# Patient Record
Sex: Male | Born: 1970 | Race: Black or African American | Hispanic: No | Marital: Single | Smoking: Former smoker
Health system: Southern US, Community
[De-identification: ages and names within clinical notes are randomized; demographics above are authoritative.]

## PROBLEM LIST (undated history)

## (undated) HISTORY — PX: KNEE SURGERY: SHX244

---

## 2000-12-14 ENCOUNTER — Emergency Department (HOSPITAL_COMMUNITY): Admission: EM | Admit: 2000-12-14 | Discharge: 2000-12-15 | Payer: Self-pay | Admitting: Emergency Medicine

## 2001-05-21 ENCOUNTER — Encounter: Payer: Self-pay | Admitting: Emergency Medicine

## 2001-05-21 ENCOUNTER — Emergency Department (HOSPITAL_COMMUNITY): Admission: EM | Admit: 2001-05-21 | Discharge: 2001-05-21 | Payer: Self-pay | Admitting: Emergency Medicine

## 2005-06-25 ENCOUNTER — Encounter: Payer: Self-pay | Admitting: Emergency Medicine

## 2005-06-26 ENCOUNTER — Inpatient Hospital Stay (HOSPITAL_COMMUNITY): Admission: EM | Admit: 2005-06-26 | Discharge: 2005-06-28 | Payer: Self-pay | Admitting: Emergency Medicine

## 2008-12-29 ENCOUNTER — Emergency Department (HOSPITAL_COMMUNITY): Admission: EM | Admit: 2008-12-29 | Discharge: 2008-12-29 | Payer: Self-pay | Admitting: Emergency Medicine

## 2010-11-17 ENCOUNTER — Emergency Department (HOSPITAL_COMMUNITY): Payer: Self-pay

## 2010-11-17 ENCOUNTER — Emergency Department (HOSPITAL_COMMUNITY)
Admission: EM | Admit: 2010-11-17 | Discharge: 2010-11-17 | Disposition: A | Discharge status: Home / Self Care | Payer: Self-pay | Attending: Emergency Medicine | Admitting: Emergency Medicine

## 2010-11-17 DIAGNOSIS — E876 Hypokalemia: Secondary | ICD-10-CM | POA: Insufficient documentation

## 2010-11-17 DIAGNOSIS — R209 Unspecified disturbances of skin sensation: Secondary | ICD-10-CM | POA: Insufficient documentation

## 2010-11-17 DIAGNOSIS — F17200 Nicotine dependence, unspecified, uncomplicated: Secondary | ICD-10-CM | POA: Insufficient documentation

## 2010-11-17 DIAGNOSIS — J45909 Unspecified asthma, uncomplicated: Secondary | ICD-10-CM | POA: Insufficient documentation

## 2010-11-17 DIAGNOSIS — F411 Generalized anxiety disorder: Secondary | ICD-10-CM | POA: Insufficient documentation

## 2010-11-17 DIAGNOSIS — R079 Chest pain, unspecified: Secondary | ICD-10-CM | POA: Insufficient documentation

## 2010-11-17 LAB — POCT I-STAT, CHEM 8
BUN: 6 mg/dL (ref 6–23)
Creatinine, Ser: 1.4 mg/dL (ref 0.4–1.5)
Glucose, Bld: 110 mg/dL — ABNORMAL HIGH (ref 70–99)
Hemoglobin: 16 g/dL (ref 13.0–17.0)
TCO2: 20 mmol/L (ref 0–100)

## 2010-11-17 LAB — RAPID URINE DRUG SCREEN, HOSP PERFORMED
Amphetamines: NOT DETECTED
Barbiturates: NOT DETECTED
Benzodiazepines: NOT DETECTED
Cocaine: NOT DETECTED

## 2010-11-17 LAB — DIFFERENTIAL
Basophils Absolute: 0 10*3/uL (ref 0.0–0.1)
Basophils Relative: 1 % (ref 0–1)
Lymphocytes Relative: 54 % — ABNORMAL HIGH (ref 12–46)
Monocytes Absolute: 0.5 10*3/uL (ref 0.1–1.0)
Monocytes Relative: 11 % (ref 3–12)
Neutro Abs: 1.6 10*3/uL — ABNORMAL LOW (ref 1.7–7.7)
Neutrophils Relative %: 33 % — ABNORMAL LOW (ref 43–77)

## 2010-11-17 LAB — ETHANOL: Alcohol, Ethyl (B): 95 mg/dL — ABNORMAL HIGH (ref 0–10)

## 2010-11-17 LAB — CBC
HCT: 41.8 % (ref 39.0–52.0)
Hemoglobin: 15.3 g/dL (ref 13.0–17.0)
MCH: 31.9 pg (ref 26.0–34.0)
MCHC: 36.6 g/dL — ABNORMAL HIGH (ref 30.0–36.0)
RBC: 4.79 MIL/uL (ref 4.22–5.81)

## 2010-11-17 LAB — POCT CARDIAC MARKERS: Myoglobin, poc: 47.5 ng/mL (ref 12–200)
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
## 2011-02-03 ENCOUNTER — Emergency Department (HOSPITAL_COMMUNITY)
Admission: EM | Admit: 2011-02-03 | Discharge: 2011-02-03 | Disposition: A | Discharge status: Home / Self Care | Payer: Self-pay | Attending: Emergency Medicine | Admitting: Emergency Medicine

## 2011-02-03 DIAGNOSIS — J40 Bronchitis, not specified as acute or chronic: Secondary | ICD-10-CM | POA: Insufficient documentation

## 2011-02-03 DIAGNOSIS — R0609 Other forms of dyspnea: Secondary | ICD-10-CM | POA: Insufficient documentation

## 2011-02-03 DIAGNOSIS — R0989 Other specified symptoms and signs involving the circulatory and respiratory systems: Secondary | ICD-10-CM | POA: Insufficient documentation

## 2011-02-03 DIAGNOSIS — J45909 Unspecified asthma, uncomplicated: Secondary | ICD-10-CM | POA: Insufficient documentation

## 2011-03-06 ENCOUNTER — Emergency Department (HOSPITAL_COMMUNITY)
Admission: EM | Admit: 2011-03-06 | Discharge: 2011-03-06 | Disposition: A | Discharge status: Home / Self Care | Payer: Self-pay | Attending: Emergency Medicine | Admitting: Emergency Medicine

## 2011-03-06 DIAGNOSIS — J45909 Unspecified asthma, uncomplicated: Secondary | ICD-10-CM | POA: Insufficient documentation

## 2011-03-06 DIAGNOSIS — R05 Cough: Secondary | ICD-10-CM | POA: Insufficient documentation

## 2011-03-06 DIAGNOSIS — R059 Cough, unspecified: Secondary | ICD-10-CM | POA: Insufficient documentation

## 2011-03-10 ENCOUNTER — Emergency Department (HOSPITAL_COMMUNITY)
Admission: EM | Admit: 2011-03-10 | Discharge: 2011-03-10 | Disposition: A | Discharge status: Home / Self Care | Payer: Self-pay | Attending: Emergency Medicine | Admitting: Emergency Medicine

## 2011-03-10 ENCOUNTER — Emergency Department (HOSPITAL_COMMUNITY): Payer: Self-pay

## 2011-03-10 DIAGNOSIS — J45901 Unspecified asthma with (acute) exacerbation: Secondary | ICD-10-CM | POA: Insufficient documentation

## 2011-03-10 DIAGNOSIS — R059 Cough, unspecified: Secondary | ICD-10-CM | POA: Insufficient documentation

## 2011-03-10 DIAGNOSIS — F17200 Nicotine dependence, unspecified, uncomplicated: Secondary | ICD-10-CM | POA: Insufficient documentation

## 2011-03-10 DIAGNOSIS — R0602 Shortness of breath: Secondary | ICD-10-CM | POA: Insufficient documentation

## 2011-03-10 DIAGNOSIS — R61 Generalized hyperhidrosis: Secondary | ICD-10-CM | POA: Insufficient documentation

## 2011-03-10 DIAGNOSIS — R05 Cough: Secondary | ICD-10-CM | POA: Insufficient documentation

## 2011-03-10 LAB — BASIC METABOLIC PANEL
BUN: 15 mg/dL (ref 6–23)
CO2: 26 mEq/L (ref 19–32)
Calcium: 9.3 mg/dL (ref 8.4–10.5)
Creatinine, Ser: 0.83 mg/dL (ref 0.50–1.35)
Glucose, Bld: 165 mg/dL — ABNORMAL HIGH (ref 70–99)
Sodium: 139 mEq/L (ref 135–145)

## 2011-03-10 LAB — DIFFERENTIAL
Lymphocytes Relative: 40 % (ref 12–46)
Lymphs Abs: 1.7 10*3/uL (ref 0.7–4.0)
Monocytes Absolute: 0.4 10*3/uL (ref 0.1–1.0)
Monocytes Relative: 10 % (ref 3–12)
Neutro Abs: 1.7 10*3/uL (ref 1.7–7.7)
Neutrophils Relative %: 43 % (ref 43–77)

## 2011-03-10 LAB — CBC
HCT: 42.9 % (ref 39.0–52.0)
Hemoglobin: 15.2 g/dL (ref 13.0–17.0)
MCH: 31.9 pg (ref 26.0–34.0)
MCHC: 35.4 g/dL (ref 30.0–36.0)
MCV: 90.1 fL (ref 78.0–100.0)
RBC: 4.76 MIL/uL (ref 4.22–5.81)

## 2011-03-27 ENCOUNTER — Emergency Department (HOSPITAL_COMMUNITY)
Admission: EM | Admit: 2011-03-27 | Discharge: 2011-03-27 | Disposition: A | Discharge status: Home / Self Care | Payer: Self-pay | Attending: Emergency Medicine | Admitting: Emergency Medicine

## 2011-03-27 DIAGNOSIS — J45909 Unspecified asthma, uncomplicated: Secondary | ICD-10-CM | POA: Insufficient documentation

## 2011-03-27 DIAGNOSIS — F17200 Nicotine dependence, unspecified, uncomplicated: Secondary | ICD-10-CM | POA: Insufficient documentation

## 2011-03-27 DIAGNOSIS — R0602 Shortness of breath: Secondary | ICD-10-CM | POA: Insufficient documentation

## 2011-03-27 DIAGNOSIS — R0609 Other forms of dyspnea: Secondary | ICD-10-CM | POA: Insufficient documentation

## 2011-03-27 DIAGNOSIS — R0989 Other specified symptoms and signs involving the circulatory and respiratory systems: Secondary | ICD-10-CM | POA: Insufficient documentation

## 2011-04-15 ENCOUNTER — Emergency Department (HOSPITAL_COMMUNITY)
Admission: EM | Admit: 2011-04-15 | Discharge: 2011-04-15 | Disposition: A | Discharge status: Home / Self Care | Payer: Self-pay | Attending: Emergency Medicine | Admitting: Emergency Medicine

## 2011-04-15 DIAGNOSIS — R42 Dizziness and giddiness: Secondary | ICD-10-CM | POA: Insufficient documentation

## 2011-04-15 DIAGNOSIS — R064 Hyperventilation: Secondary | ICD-10-CM | POA: Insufficient documentation

## 2011-04-15 DIAGNOSIS — R209 Unspecified disturbances of skin sensation: Secondary | ICD-10-CM | POA: Insufficient documentation

## 2011-04-15 LAB — POCT I-STAT, CHEM 8
BUN: 13 mg/dL (ref 6–23)
Creatinine, Ser: 1.2 mg/dL (ref 0.50–1.35)
Glucose, Bld: 80 mg/dL (ref 70–99)
Sodium: 138 mEq/L (ref 135–145)
TCO2: 19 mmol/L (ref 0–100)

## 2011-07-07 ENCOUNTER — Emergency Department (HOSPITAL_COMMUNITY)
Admission: EM | Admit: 2011-07-07 | Discharge: 2011-07-07 | Disposition: A | Discharge status: Home / Self Care | Payer: Self-pay | Attending: Emergency Medicine | Admitting: Emergency Medicine

## 2011-07-07 DIAGNOSIS — J45901 Unspecified asthma with (acute) exacerbation: Secondary | ICD-10-CM | POA: Insufficient documentation

## 2011-07-07 DIAGNOSIS — R0682 Tachypnea, not elsewhere classified: Secondary | ICD-10-CM | POA: Insufficient documentation

## 2011-07-07 DIAGNOSIS — R0609 Other forms of dyspnea: Secondary | ICD-10-CM | POA: Insufficient documentation

## 2011-07-07 DIAGNOSIS — R0989 Other specified symptoms and signs involving the circulatory and respiratory systems: Secondary | ICD-10-CM | POA: Insufficient documentation

## 2011-07-07 MED ORDER — PREDNISONE 50 MG PO TABS: 50.0000 mg | ORAL_TABLET | Freq: Every day | ORAL | Status: AC

## 2011-07-07 MED ORDER — ALBUTEROL SULFATE HFA 108 (90 BASE) MCG/ACT IN AERS: 2.0000 | INHALATION_SPRAY | RESPIRATORY_TRACT | Status: DC | PRN

## 2011-07-07 MED ORDER — PREDNISONE 20 MG PO TABS
60.0000 mg | ORAL_TABLET | Freq: Once | ORAL | Status: AC
  Administered 2011-07-07: 60 mg via ORAL
  Filled 2011-07-07: qty 3

## 2011-07-07 MED ORDER — ALBUTEROL SULFATE (5 MG/ML) 0.5% IN NEBU
2.5000 mg | INHALATION_SOLUTION | Freq: Once | RESPIRATORY_TRACT | Status: AC
  Administered 2011-07-07: 2.5 mg via RESPIRATORY_TRACT
  Filled 2011-07-07: qty 0.5

## 2011-07-07 NOTE — ED Notes (Signed)
Pt is having difficulty breathing due to asthma. Called ems this am about 0300 for the same thing. They gave an albuterol treatment and he did not come in. About 45 mins ago he had another attack. Was given 5mg  of albuterol with complete relief of symptoms per ems. Upper lobe whezzing prior to breathing treatment half way done with treatment he had clear lung sounds. Spo2 was 86 on room air at scene.
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
## 2011-07-28 ENCOUNTER — Emergency Department (HOSPITAL_COMMUNITY): Payer: Self-pay

## 2011-07-28 ENCOUNTER — Emergency Department (HOSPITAL_COMMUNITY)
Admission: EM | Admit: 2011-07-28 | Discharge: 2011-07-28 | Disposition: A | Discharge status: Home / Self Care | Payer: Self-pay | Attending: Emergency Medicine | Admitting: Emergency Medicine

## 2011-07-28 ENCOUNTER — Encounter (HOSPITAL_COMMUNITY): Payer: Self-pay | Admitting: Emergency Medicine

## 2011-07-28 DIAGNOSIS — R05 Cough: Secondary | ICD-10-CM | POA: Insufficient documentation

## 2011-07-28 DIAGNOSIS — R0602 Shortness of breath: Secondary | ICD-10-CM | POA: Insufficient documentation

## 2011-07-28 DIAGNOSIS — J45901 Unspecified asthma with (acute) exacerbation: Secondary | ICD-10-CM | POA: Insufficient documentation

## 2011-07-28 DIAGNOSIS — R059 Cough, unspecified: Secondary | ICD-10-CM | POA: Insufficient documentation

## 2011-07-28 MED ORDER — ALBUTEROL SULFATE HFA 108 (90 BASE) MCG/ACT IN AERS
2.0000 | INHALATION_SPRAY | Freq: Once | RESPIRATORY_TRACT | Status: DC
  Filled 2011-07-28: qty 6.7

## 2011-07-28 MED ORDER — METHYLPREDNISOLONE SODIUM SUCC 125 MG IJ SOLR
INTRAMUSCULAR | Status: AC
  Filled 2011-07-28: qty 2

## 2011-07-28 MED ORDER — ALBUTEROL SULFATE (5 MG/ML) 0.5% IN NEBU
10.0000 mg | INHALATION_SOLUTION | Freq: Once | RESPIRATORY_TRACT | Status: AC
  Administered 2011-07-28: 10 mg via RESPIRATORY_TRACT
  Filled 2011-07-28: qty 2

## 2011-07-28 MED ORDER — IPRATROPIUM BROMIDE 0.02 % IN SOLN
RESPIRATORY_TRACT | Status: AC
  Filled 2011-07-28: qty 2.5

## 2011-07-28 MED ORDER — ALBUTEROL SULFATE (5 MG/ML) 0.5% IN NEBU
INHALATION_SOLUTION | RESPIRATORY_TRACT | Status: AC
  Filled 2011-07-28: qty 2

## 2011-07-28 MED ORDER — PREDNISONE (PAK) 10 MG PO TABS: ORAL_TABLET | ORAL | Status: AC

## 2011-07-28 NOTE — ED Provider Notes (Addendum)
History     CSN: [id number]  Arrival date & time 07/28/11  1651   First MD Initiated Contact with Patient 07/28/11 1652     5:48 PM HPI Patient reports having an asthma attack after dinner. States he was unable to catch his breath and ran out of his albuterol inhaler. Reports last night also had an asthma attack but after EMS gave him albuterol symptoms were improved so he did not come to the hospital. Denies chest pain, fever, rhinorrhea, sore throat. EMS reports negative  breath sounds. After patient was given 10mg  albuterol and Solu-Medrol patient's symptoms improved. Patient now has bilateral wheezing.  Patient is a 40 y.o. male presenting with wheezing. The history is provided by the patient.  Wheezing  The current episode started yesterday. The onset was gradual. The problem occurs occasionally. The problem has been gradually worsening. The problem is severe. Relieved by: Cold air  The symptoms are aggravated by activity. Associated symptoms include cough, shortness of breath and wheezing. Pertinent negatives include no chest pain, no fever and no rhinorrhea. The cough is non-productive. He has not inhaled smoke recently. His past medical history is significant for asthma.    Past Medical History  Diagnosis Date  . Asthma     Past Surgical History  Procedure Date  . Knee surgery     Family History  Problem Relation Age of Onset  . Asthma Mother     History  Substance Use Topics  . Smoking status: Former Smoker    Quit date: 06/23/2011  . Smokeless tobacco: Not on file   Comment: Was smoking 0.5 packs a day  . Alcohol Use: Yes      Review of Systems  Constitutional: Negative for fever and chills.  HENT: Negative for rhinorrhea.   Respiratory: Positive for cough, shortness of breath and wheezing.   Cardiovascular: Negative for chest pain.  Gastrointestinal: Negative for nausea, vomiting, abdominal pain and diarrhea.  Musculoskeletal: Negative for back pain.    Neurological: Negative for dizziness, weakness and headaches.  All other systems reviewed and are negative.    Allergies  Review of patient's allergies indicates no known allergies.  Home Medications   Current Outpatient Rx  Name Route Sig Dispense Refill  . ALBUTEROL SULFATE HFA 108 (90 BASE) MCG/ACT IN AERS Inhalation Inhale 2 puffs into the lungs every 4 (four) hours as needed. For shortness of breath     . ALBUTEROL SULFATE (5 MG/ML) 0.5% IN NEBU Nebulization Take 2.5 mg by nebulization once.      . IPRATROPIUM BROMIDE 0.02 % IN SOLN Nebulization Take 500 mcg by nebulization once.      . METHYLPREDNISOLONE SODIUM SUCC 125 MG IJ SOLR Intravenous Inject 125 mg into the vein once.      Carma Leaven M PLUS PO TABS Oral Take 1 tablet by mouth daily.        BP 127/78  Pulse 106  Temp(Src) 97.9 F (36.6 C) (Oral)  Resp 28  Ht 5\' 10"  (1.778 m)  Wt 160 lb (72.576 kg)  BMI 22.96 kg/m2  SpO2 100%  Physical Exam  Vitals reviewed. Constitutional: He is oriented to person, place, and time. He appears well-developed and well-nourished.  HENT:  Head: Normocephalic and atraumatic.  Right Ear: External ear normal.  Left Ear: External ear normal.  Nose: Nose normal.  Mouth/Throat: Uvula is midline, oropharynx is clear and moist and mucous membranes are normal.  Eyes: Conjunctivae are normal. Pupils are equal, round, and reactive to  light.  Neck: Normal range of motion. Neck supple.  Cardiovascular: Normal rate, regular rhythm and normal heart sounds.  Exam reveals no gallop and no friction rub.   No murmur heard. Pulmonary/Chest: Effort normal. He has wheezes in the right upper field, the right middle field, the right lower field, the left upper field, the left middle field and the left lower field. He has no rhonchi. He has no rales.  Abdominal: Soft. Bowel sounds are normal.  Neurological: He is alert and oriented to person, place, and time.  Skin: Skin is warm and dry. No rash noted. No  erythema. No pallor.  Psychiatric: He has a normal mood and affect. His behavior is normal.    ED Course  Procedures    MDM  7:24 PM Patient reports significantly improved symptoms however still continues to wheeze. Would like to get patient a one-hour long nebulizer treatments and reassess. Will provide patient with albuterol inhaler to go home with and a prednisone pack. Patient is to make the move to CDU pending reassessment.       Thomasene Lot, [place] 07/28/11 1925  9:23 PM Patient has had 1hr long neb. Reports significantly improved symptoms lung auscultation reveals resolution of wheezing. Will discharge with albuterol inhaler and a Medrol Dosepak. Advised immediate return to the emergency pertinent for worsening symptoms patient agrees and is ready for discharge.  Thomasene Lot, [place] 07/28/11 2124

## 2011-07-28 NOTE — ED Notes (Signed)
Here with c/o wheezing x 2 days. Sl cough. Non productive.bil exp wheeze tight. Pa at bs.denies c/p

## 2011-07-28 NOTE — ED Provider Notes (Signed)
Medical screening examination/treatment/procedure(s) were performed by non-physician practitioner and as supervising physician I was immediately available for consultation/collaboration.   Nat Christen, MD 07/28/11 2001

## 2011-07-28 NOTE — ED Provider Notes (Signed)
Medical screening examination/treatment/procedure(s) were performed by non-physician practitioner and as supervising physician I was immediately available for consultation/collaboration.   Damario Gillie A Tyree Fluharty, MD 07/28/11 2146 

## 2011-07-28 NOTE — ED Notes (Signed)
Lennette Bihari PA in with pt for dispositioning.

## 2017-03-20 ENCOUNTER — Encounter: Payer: Self-pay | Admitting: Pediatric Intensive Care
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
## 2018-02-02 ENCOUNTER — Emergency Department (HOSPITAL_COMMUNITY)
Admission: EM | Admit: 2018-02-02 | Discharge: 2018-02-02 | Payer: Self-pay | Attending: Emergency Medicine | Admitting: Emergency Medicine

## 2018-02-02 ENCOUNTER — Other Ambulatory Visit: Payer: Self-pay

## 2018-02-02 ENCOUNTER — Emergency Department (HOSPITAL_COMMUNITY): Payer: Self-pay

## 2018-02-02 ENCOUNTER — Encounter (HOSPITAL_COMMUNITY): Payer: Self-pay

## 2018-02-02 DIAGNOSIS — S01112A Laceration without foreign body of left eyelid and periocular area, initial encounter: Secondary | ICD-10-CM | POA: Insufficient documentation

## 2018-02-02 DIAGNOSIS — S0181XA Laceration without foreign body of other part of head, initial encounter: Secondary | ICD-10-CM

## 2018-02-02 DIAGNOSIS — Y929 Unspecified place or not applicable: Secondary | ICD-10-CM | POA: Insufficient documentation

## 2018-02-02 DIAGNOSIS — Z87891 Personal history of nicotine dependence: Secondary | ICD-10-CM | POA: Insufficient documentation

## 2018-02-02 DIAGNOSIS — S0240FA Zygomatic fracture, left side, initial encounter for closed fracture: Secondary | ICD-10-CM

## 2018-02-02 DIAGNOSIS — J45909 Unspecified asthma, uncomplicated: Secondary | ICD-10-CM | POA: Insufficient documentation

## 2018-02-02 DIAGNOSIS — Y999 Unspecified external cause status: Secondary | ICD-10-CM | POA: Insufficient documentation

## 2018-02-02 DIAGNOSIS — Y939 Activity, unspecified: Secondary | ICD-10-CM | POA: Insufficient documentation

## 2018-02-02 MED ORDER — LIDOCAINE-EPINEPHRINE (PF) 2 %-1:200000 IJ SOLN
10.0000 mL | Freq: Once | INTRAMUSCULAR | Status: AC
  Administered 2018-02-02: 10 mL
  Filled 2018-02-02: qty 20
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
## 2018-02-02 NOTE — ED Notes (Signed)
Dr.Belfie at bedside performing sutures
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
